# Patient Record
Sex: Male | Born: 2000 | Race: Black or African American | Hispanic: No | Marital: Single | Smoking: Never smoker
Health system: Southern US, Community
[De-identification: ages and names within clinical notes are randomized; demographics above are authoritative.]

---
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
## 2021-11-24 ENCOUNTER — Ambulatory Visit: Payer: Self-pay | Admitting: *Deleted

## 2021-11-24 NOTE — Telephone Encounter (Signed)
Pt stated her boyfriend has a possible ear infection and may need covid test. Pt is asking for clinical advice and or options on where to go.   Pt seeking clinical advice.      Chief Complaint: Cough Symptoms: Productive cough, clear. Earache right ear, chills, sore throat,headache Frequency: 6 days Pertinent Negatives: Patient denies SOB Disposition: [] ED /[] Urgent Care (no appt availability in office) / [] Appointment(In office/virtual)/ []  [facility]/ [] Home Care/ [] Refused Recommended Disposition /[x] [facility]/ []  Follow-up with PCP Additional Notes: No PCP. [facility]. Care advise provided. Pt verbalizes understanding.  Reason for Disposition  Earache  Answer Assessment - Initial Assessment Questions 1. ONSET: "When did the cough begin?"      Last Friday 2. SEVERITY: "How bad is the cough today?"      Moderate 3. SPUTUM: "Describe the color of your sputum" (none, dry cough; clear, white, yellow, green)     Clear 4. HEMOPTYSIS: "Are you coughing up any blood?" If so ask: "How much?" (flecks, streaks, tablespoons, etc.)     No 5. DIFFICULTY BREATHING: "Are you having difficulty breathing?" If Yes, ask: "How bad is it?" (e.g., mild, moderate, severe)    - MILD: No SOB at rest, mild SOB with walking, speaks normally in sentences, can lie down, no retractions, pulse < 100.    - MODERATE: SOB at rest, SOB with minimal exertion and prefers to sit, cannot lie down flat, speaks in phrases, mild retractions, audible wheezing, pulse 100-120.    - SEVERE: Very SOB at rest, speaks in single words, struggling to breathe, sitting hunched forward, retractions, pulse > 120      no 6. FEVER: "Do you have a fever?" If Yes, ask: "What is your temperature, how was it measured, and when did it start?"     Unsure, chills and sweating 7. CARDIAC HISTORY: "Do you have any history of heart disease?" (e.g., heart attack, congestive heart failure)       8. LUNG HISTORY:  "Do you have any history of lung disease?"  (e.g., pulmonary embolus, asthma, emphysema)      9. PE RISK FACTORS: "Do you have a history of blood clots?" (or: recent major surgery, recent prolonged travel, bedridden)      10. OTHER SYMPTOMS: "Do you have any other symptoms?" (e.g., runny nose, wheezing, chest pain)       Mild headache, chills,sore throat  Protocols used: Cough - Acute Productive-A-AH

## 2021-12-09 IMAGING — DX DG ANKLE COMPLETE 3+V*R*
3 series · 3 of 3 positions shown · non-contrast
Comparison: None.

CLINICAL DATA: 19-year-old male status post twisting injury 2 days
ago with continued lateral pain and swelling.

EXAM:
RIGHT ANKLE - COMPLETE 3+ VIEW

[ankle ap]
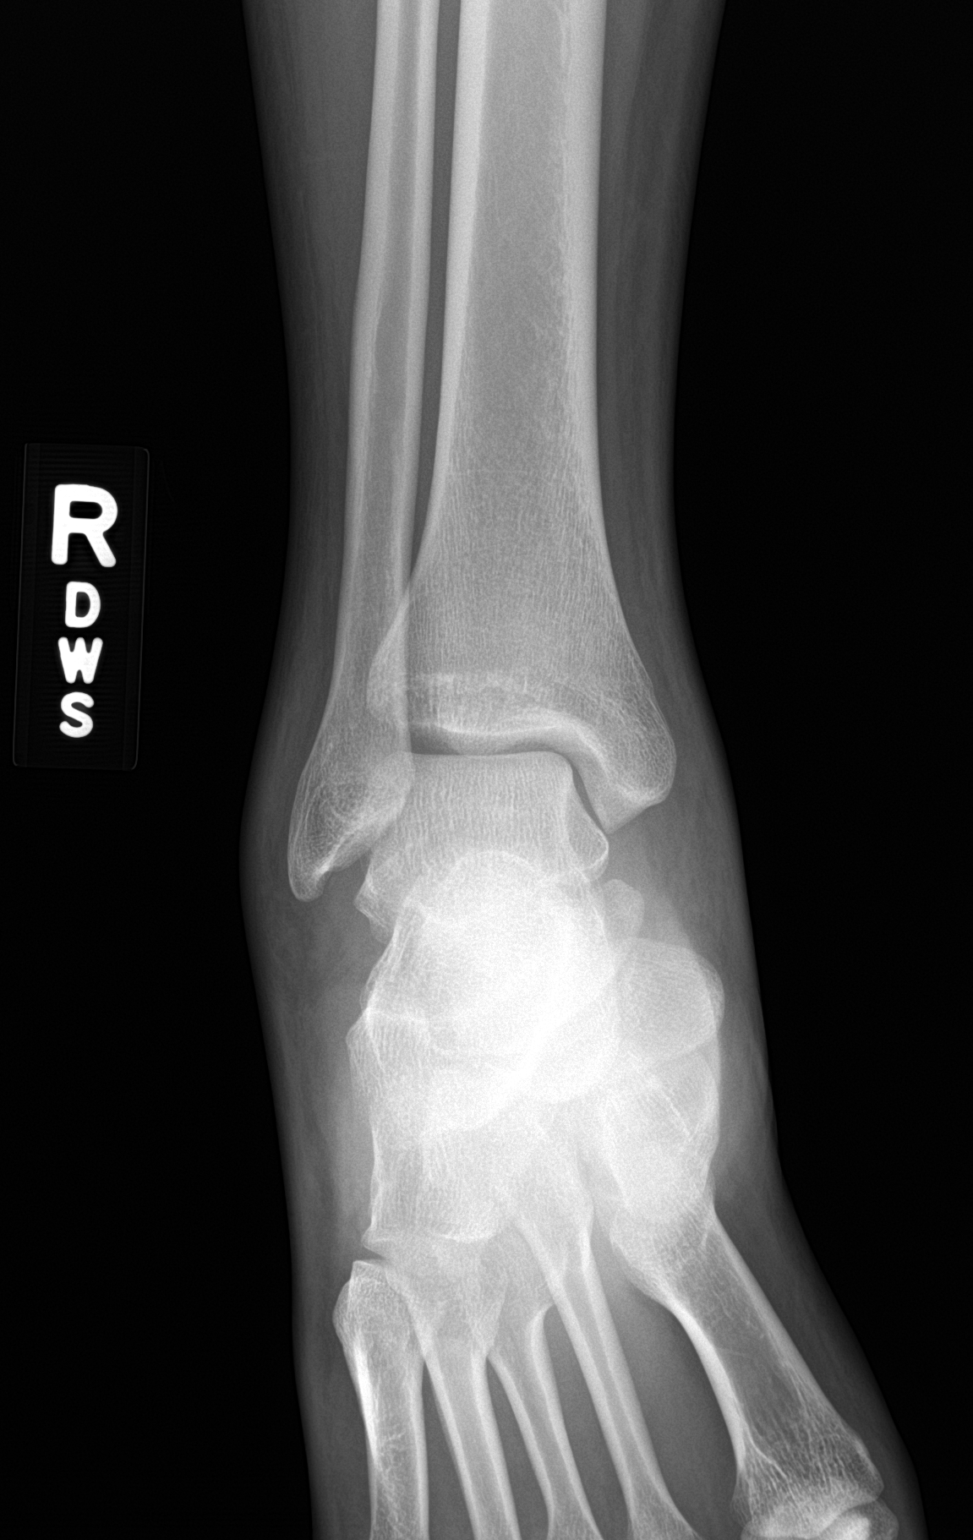

[ankle obl]
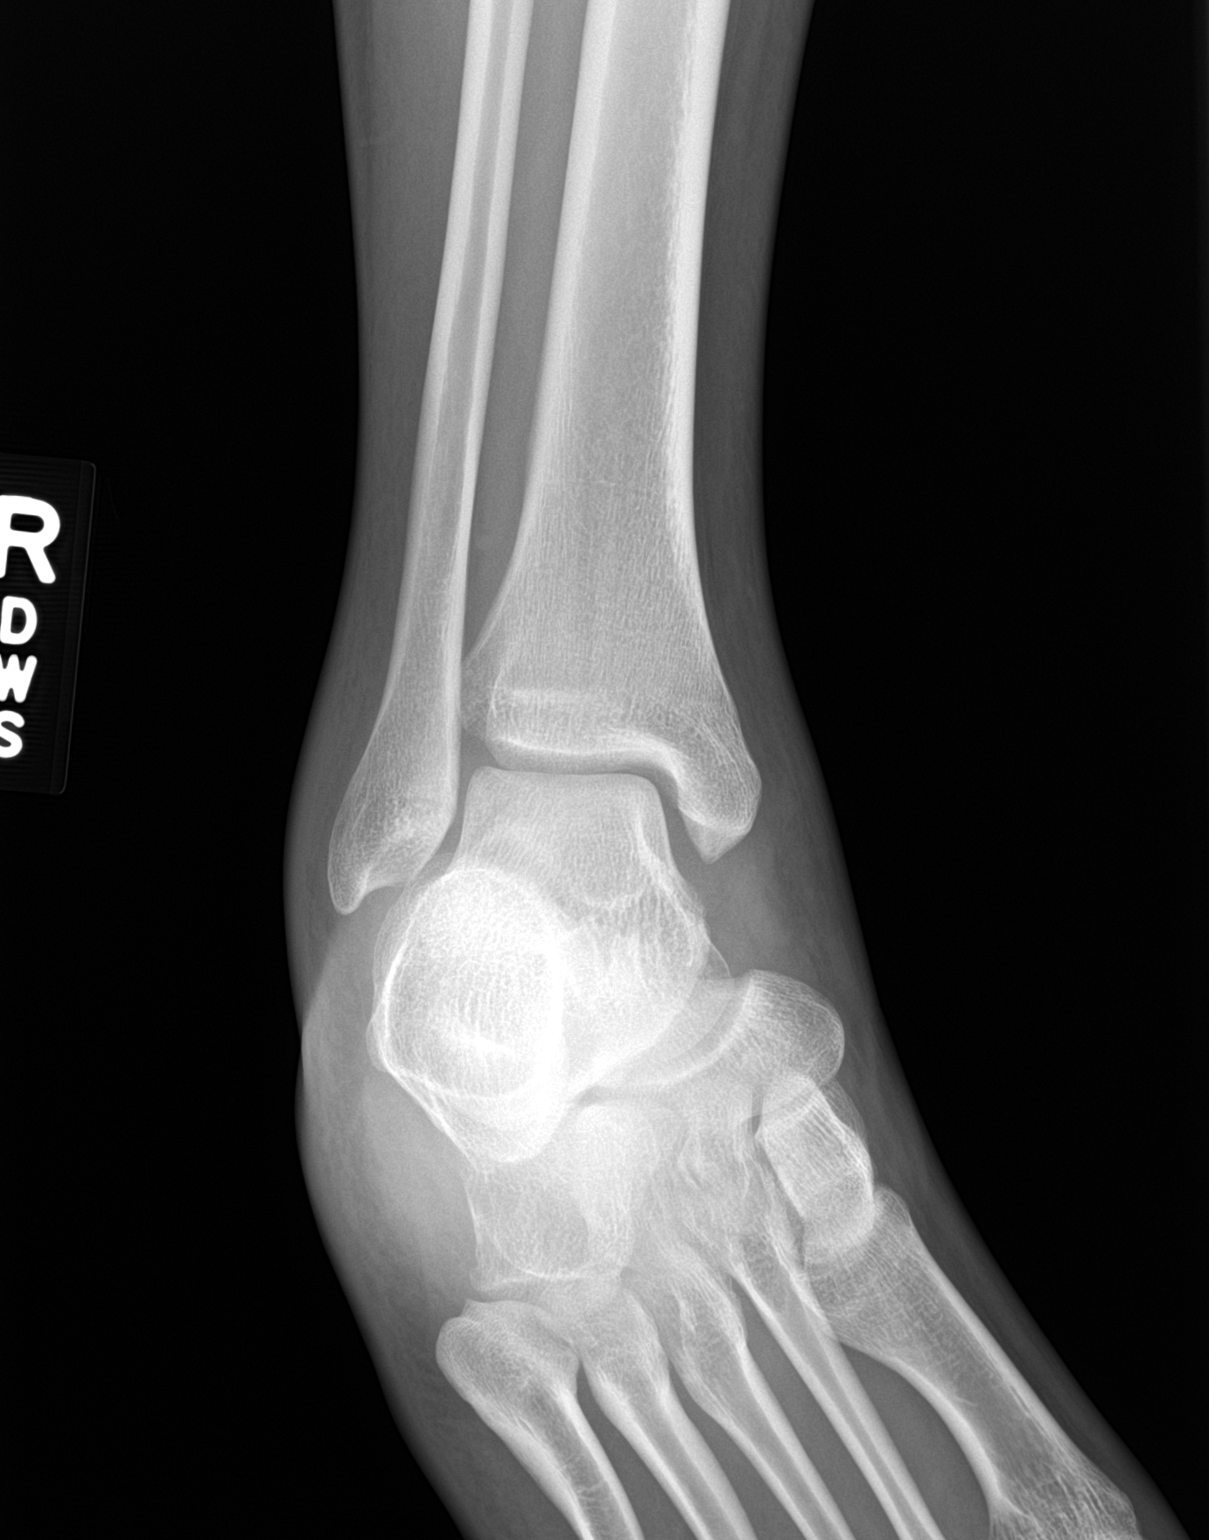

[ankle lat]
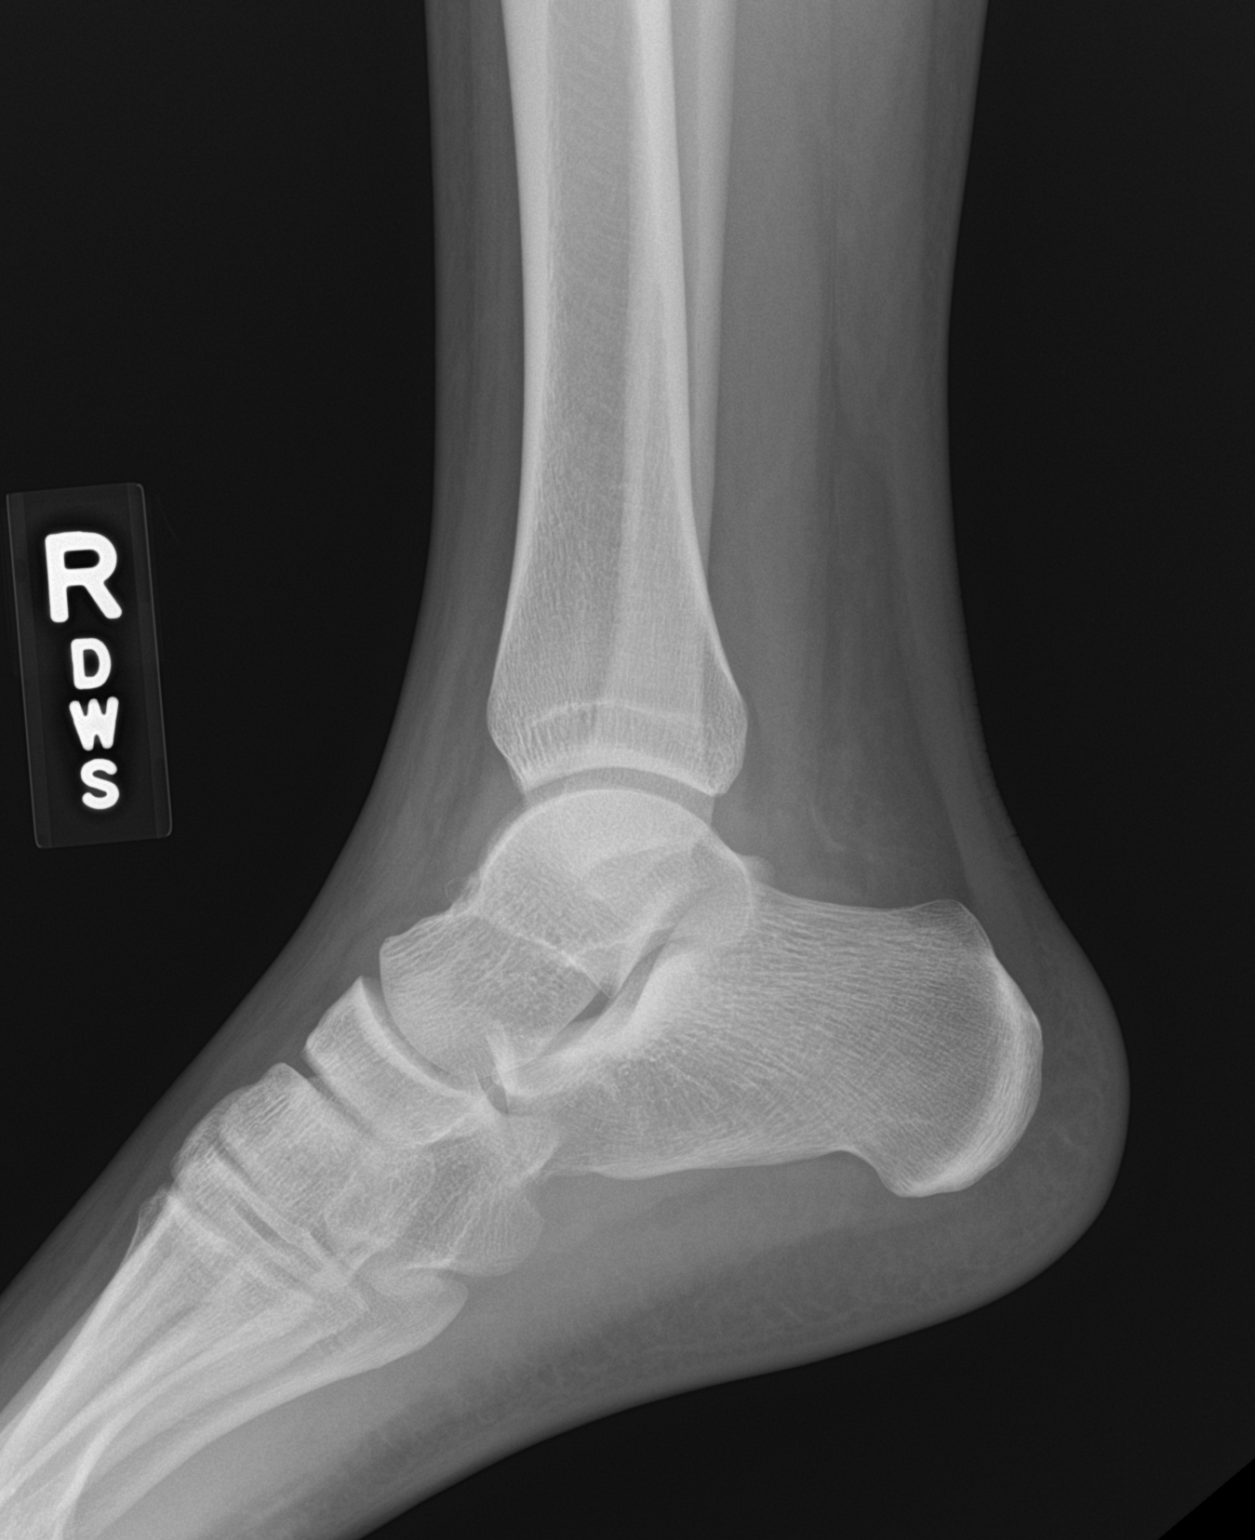

[3 of 3 positions shown; findings below may reference images not displayed]

FINDINGS: Skeletally immature. Bone mineralization is within normal limits.
Normal mortise joint alignment. Talar dome intact. Distal tibia,
fibula, calcaneus and visible other bones of the right foot appear
intact. Generalized soft tissue swelling. And probable small ankle
joint effusion.

Negative.
IMPRESSION: Soft tissue swelling and small joint effusion but no fracture or
dislocation identified about the right ankle.
